# Patient Record
Sex: Male | Born: 1990 | Race: Black or African American | Hispanic: No | Marital: Single | Smoking: Never smoker
Health system: Southern US, Community
[De-identification: ages and names within clinical notes are randomized; demographics above are authoritative.]

## PROBLEM LIST (undated history)

## (undated) DIAGNOSIS — J45909 Unspecified asthma, uncomplicated: Secondary | ICD-10-CM

---

## 2017-07-26 ENCOUNTER — Emergency Department (HOSPITAL_COMMUNITY): Payer: Self-pay

## 2017-07-26 ENCOUNTER — Emergency Department (HOSPITAL_COMMUNITY)
Admission: EM | Admit: 2017-07-26 | Discharge: 2017-07-26 | Disposition: A | Discharge status: Home / Self Care | Payer: Self-pay | Attending: Emergency Medicine | Admitting: Emergency Medicine

## 2017-07-26 ENCOUNTER — Encounter (HOSPITAL_COMMUNITY): Payer: Self-pay | Admitting: Nurse Practitioner

## 2017-07-26 DIAGNOSIS — J45909 Unspecified asthma, uncomplicated: Secondary | ICD-10-CM | POA: Insufficient documentation

## 2017-07-26 DIAGNOSIS — Z5321 Procedure and treatment not carried out due to patient leaving prior to being seen by health care provider: Secondary | ICD-10-CM | POA: Insufficient documentation

## 2017-07-26 HISTORY — DX: Unspecified asthma, uncomplicated: J45.909

## 2017-07-26 MED ORDER — ALBUTEROL SULFATE (2.5 MG/3ML) 0.083% IN NEBU
5.0000 mg | INHALATION_SOLUTION | Freq: Once | RESPIRATORY_TRACT | Status: AC
  Administered 2017-07-26: 5 mg via RESPIRATORY_TRACT
  Filled 2017-07-26: qty 6

## 2017-07-26 NOTE — ED Triage Notes (Signed)
Pt state he is having an Asthma flare up. C/o chest heaviness that he states "it pain that I always experience but today is lasting longer than usual." he denies any cardiac hx, states he is out of his rescue inhaler, denies wheezing but adds that the symptoms he is experiencing are typical of an asthma attack.

## 2018-05-08 IMAGING — CR DG CHEST 2V
2 series · 2 of 2 positions shown · non-contrast
Comparison: None.

CLINICAL DATA: Chest pain and shortness of breath.

EXAM:
CHEST  2 VIEW

[w chest pa]
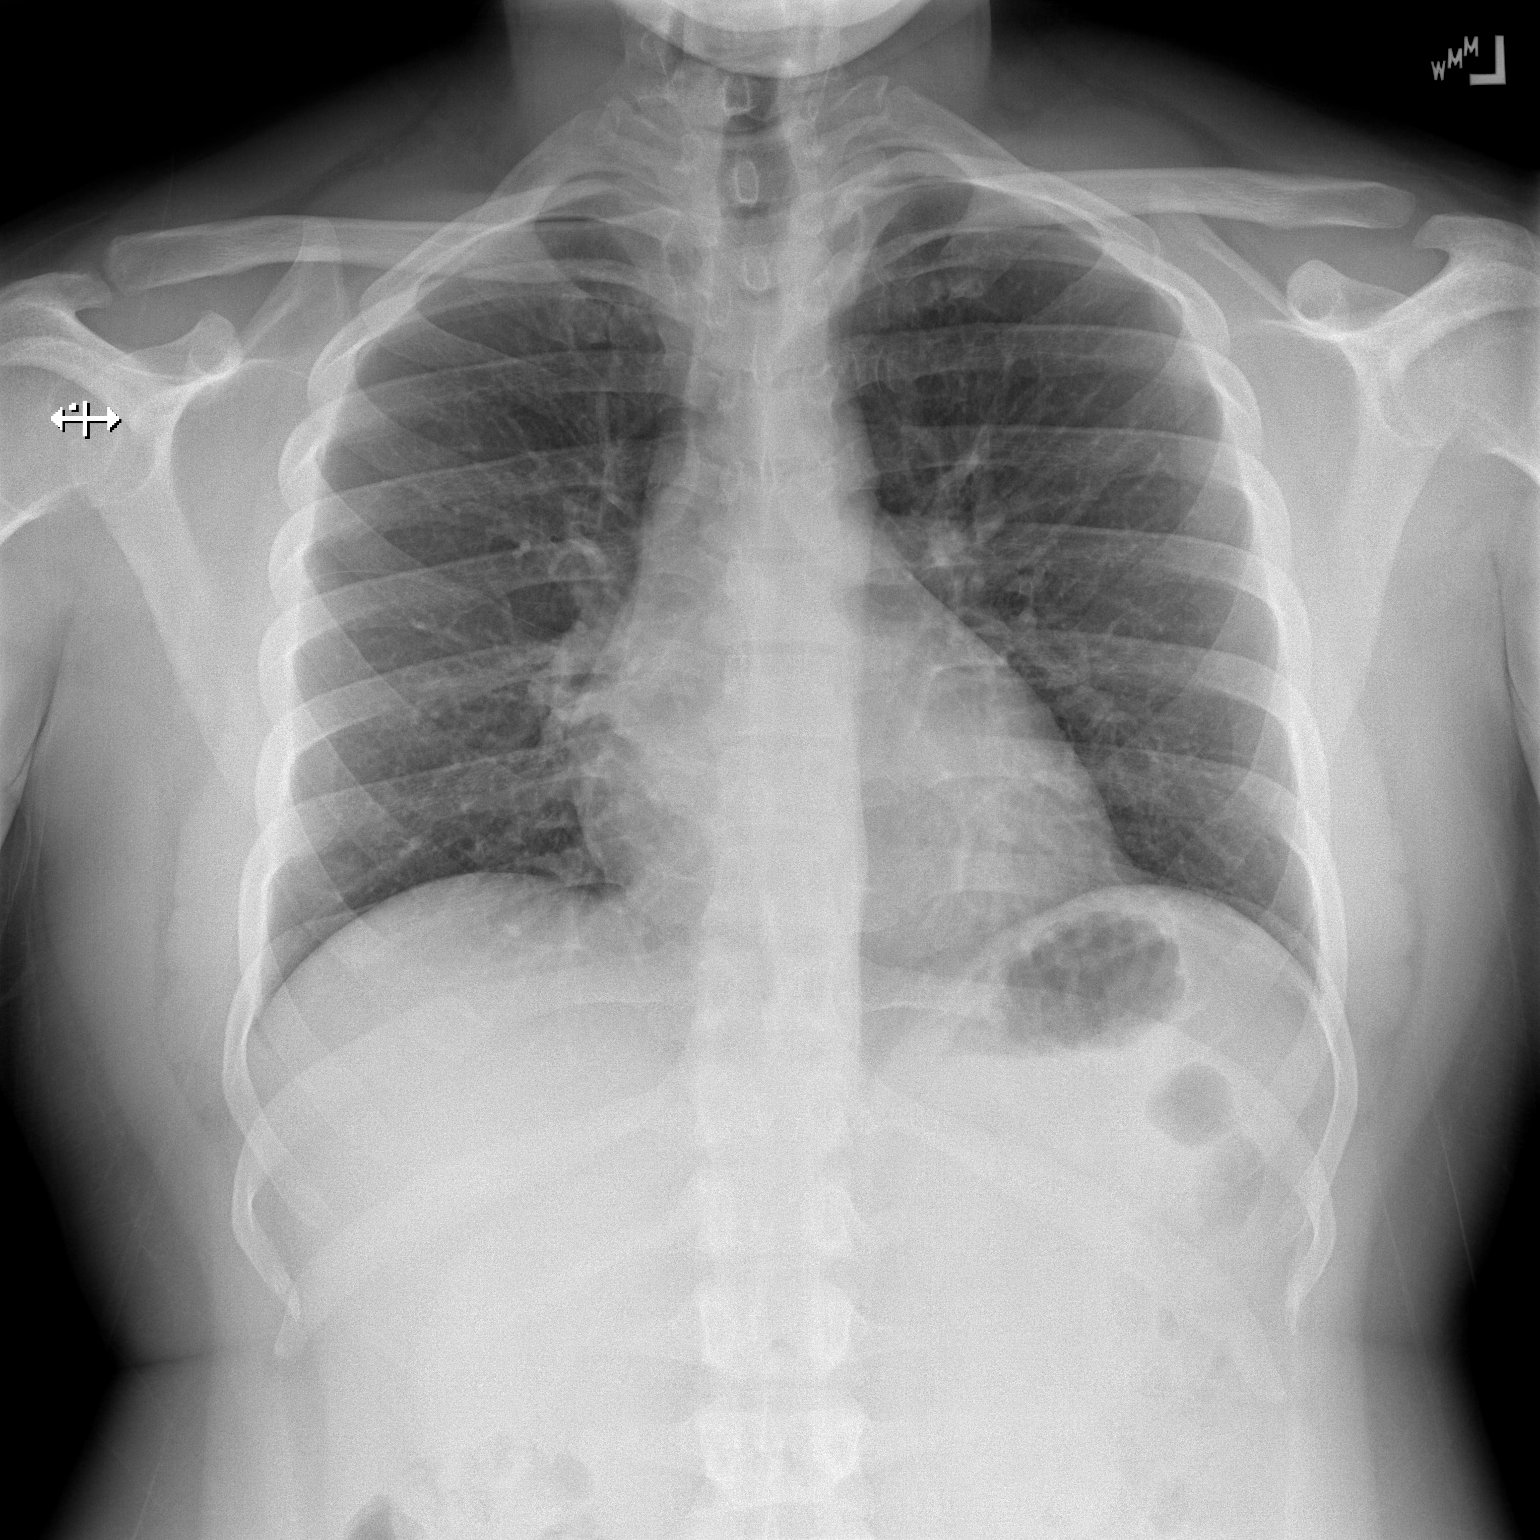

[w chest lat]
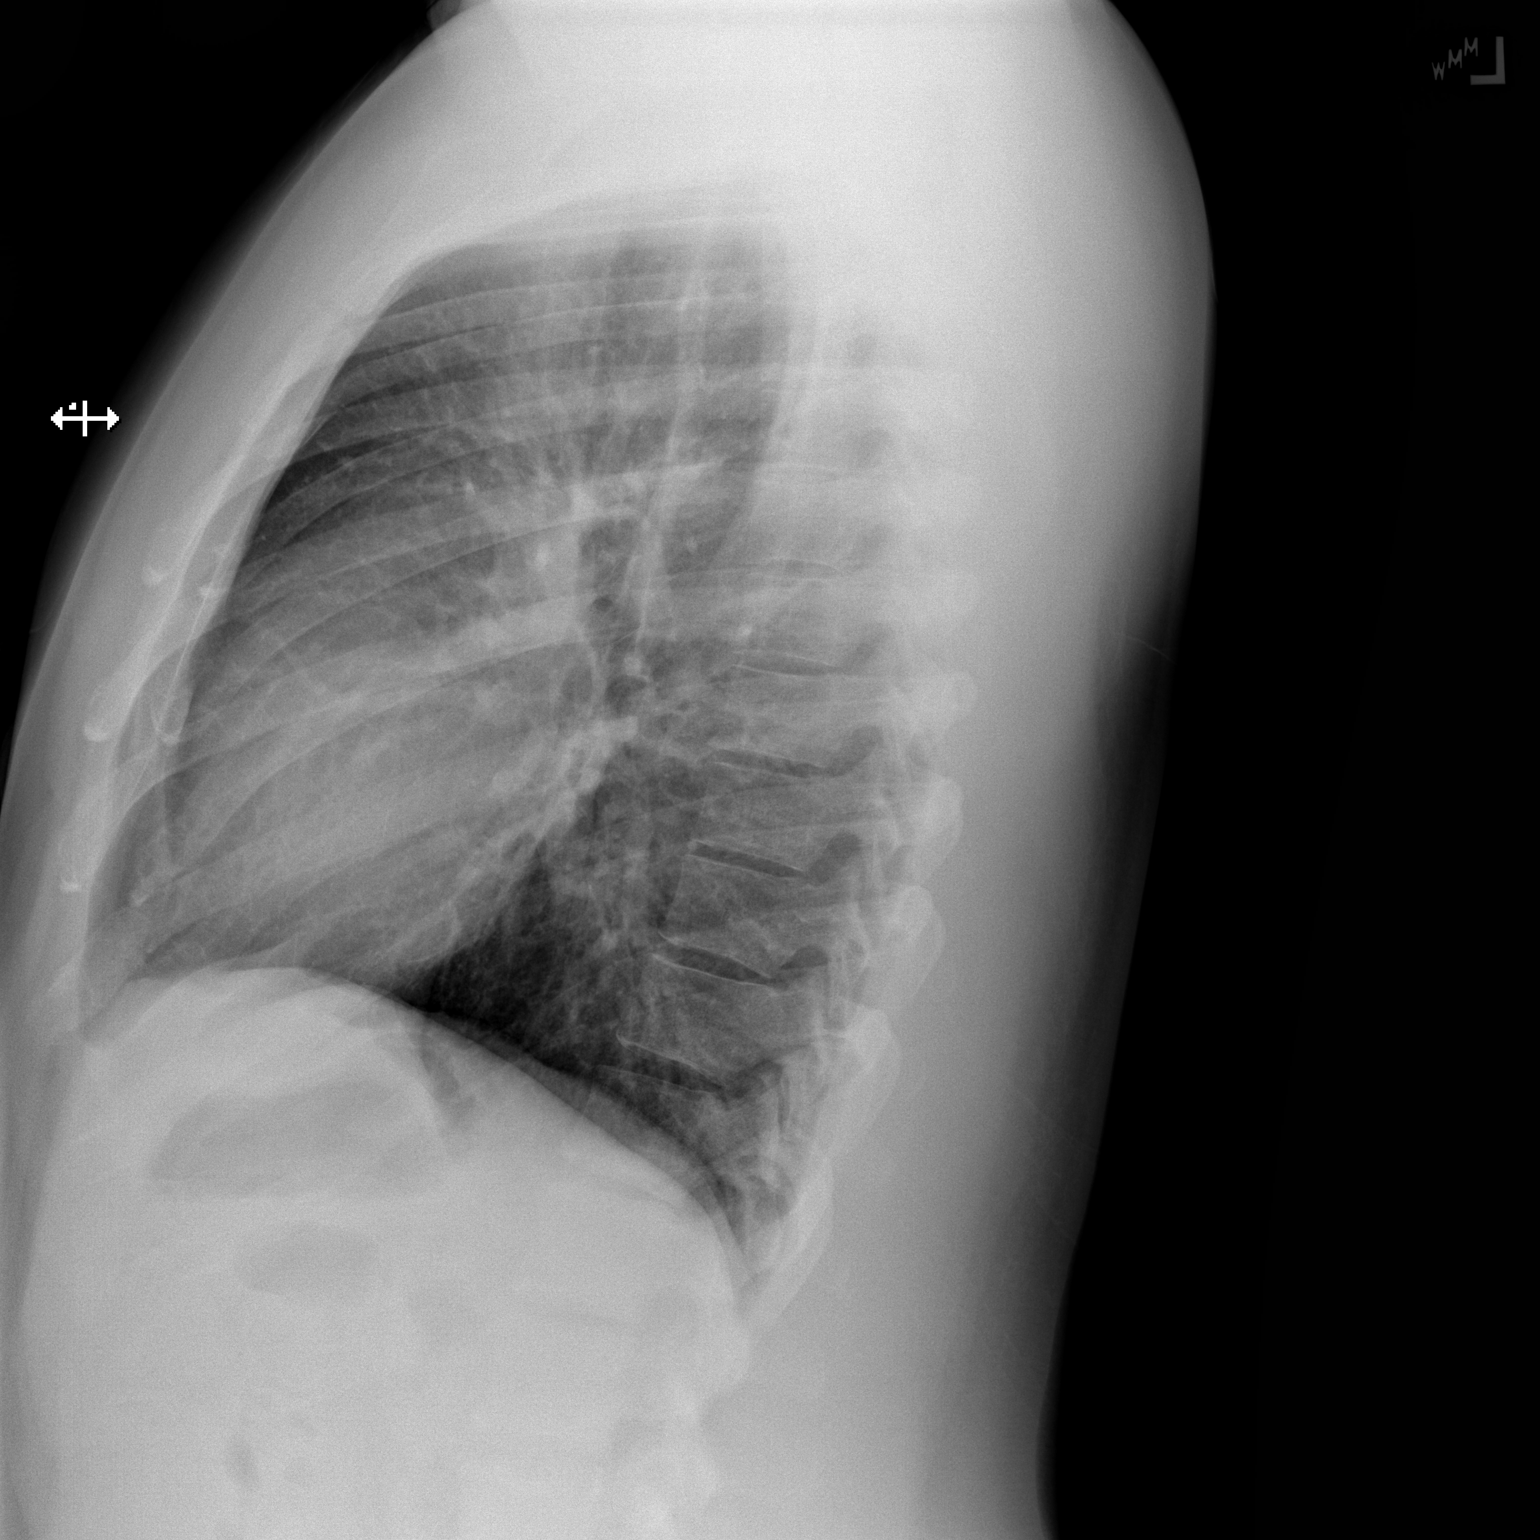

[2 of 2 positions shown; findings below may reference images not displayed]

FINDINGS: The cardiomediastinal contours are normal. The lungs are clear.
Pulmonary vasculature is normal. No consolidation, pleural effusion,
or pneumothorax. No acute osseous abnormalities are seen.
IMPRESSION: No acute abnormality.
